# Patient Record
Sex: Male | Born: 1990 | Race: Black or African American | Hispanic: No | Marital: Single | Smoking: Never smoker
Health system: Southern US, Community
[De-identification: ages and names within clinical notes are randomized; demographics above are authoritative.]

## PROBLEM LIST (undated history)

## (undated) DIAGNOSIS — R011 Cardiac murmur, unspecified: Secondary | ICD-10-CM

---
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
## 2013-07-27 ENCOUNTER — Encounter (HOSPITAL_COMMUNITY): Payer: Self-pay | Admitting: Emergency Medicine

## 2013-07-27 ENCOUNTER — Emergency Department (HOSPITAL_COMMUNITY): Payer: Self-pay

## 2013-07-27 ENCOUNTER — Emergency Department (HOSPITAL_COMMUNITY)
Admission: EM | Admit: 2013-07-27 | Discharge: 2013-07-27 | Disposition: A | Discharge status: Home / Self Care | Payer: Self-pay | Attending: Emergency Medicine | Admitting: Emergency Medicine

## 2013-07-27 DIAGNOSIS — R079 Chest pain, unspecified: Secondary | ICD-10-CM | POA: Insufficient documentation

## 2013-07-27 DIAGNOSIS — R011 Cardiac murmur, unspecified: Secondary | ICD-10-CM | POA: Insufficient documentation

## 2013-07-27 HISTORY — DX: Cardiac murmur, unspecified: R01.1

## 2013-07-27 NOTE — ED Notes (Signed)
Patient is alert and oriented x3.  He is here to be seen for intermittent chest pain that he states He has had for a few weeks that has progressively gotten worse for him to come  In today.  Currently he denies any pain, but describes it as a sharp pain in the left side of his  Chest with no radiation of pain.

## 2013-07-27 NOTE — ED Notes (Signed)
Patient is alert and oriented x3.  He was given DC instructions and follow up visit instructions.  Patient gave verbal understanding.  He was DC ambulatory under his own power to home.  V/S stable.  He was not showing any signs of distress on DC 

## 2013-07-27 NOTE — Discharge Instructions (Signed)
Chest Pain (Nonspecific) °It is often hard to give a specific diagnosis for the cause of chest pain. There is always a chance that your pain could be related to something serious, such as a heart attack or a blood clot in the lungs. You need to follow up with your caregiver for further evaluation. °CAUSES  °· Heartburn. °· Pneumonia or bronchitis. °· Anxiety or stress. °· Inflammation around your heart (pericarditis) or lung (pleuritis or pleurisy). °· A blood clot in the lung. °· A collapsed lung (pneumothorax). It can develop suddenly on its own (spontaneous pneumothorax) or from injury (trauma) to the chest. °· Shingles infection (herpes zoster virus). °The chest wall is composed of bones, muscles, and cartilage. Any of these can be the source of the pain. °· The bones can be bruised by injury. °· The muscles or cartilage can be strained by coughing or overwork. °· The cartilage can be affected by inflammation and become sore (costochondritis). °DIAGNOSIS  °Lab tests or other studies, such as X-rays, electrocardiography, stress testing, or cardiac imaging, may be needed to find the cause of your pain.  °TREATMENT  °· Treatment depends on what may be causing your chest pain. Treatment may include: °· Acid blockers for heartburn. °· Anti-inflammatory medicine. °· Pain medicine for inflammatory conditions. °· Antibiotics if an infection is present. °· You may be advised to change lifestyle habits. This includes stopping smoking and avoiding alcohol, caffeine, and chocolate. °· You may be advised to keep your head raised (elevated) when sleeping. This reduces the chance of acid going backward from your stomach into your esophagus. °· Most of the time, nonspecific chest pain will improve within 2 to 3 days with rest and mild pain medicine. °HOME CARE INSTRUCTIONS  °· If antibiotics were prescribed, take your antibiotics as directed. Finish them even if you start to feel better. °· For the next few days, avoid physical  activities that bring on chest pain. Continue physical activities as directed. °· Do not smoke. °· Avoid drinking alcohol. °· Only take over-the-counter or prescription medicine for pain, discomfort, or fever as directed by your caregiver. °· Follow your caregiver's suggestions for further testing if your chest pain does not go away. °· Keep any follow-up appointments you made. If you do not go to an appointment, you could develop lasting (chronic) problems with pain. If there is any problem keeping an appointment, you must call to reschedule. °SEEK MEDICAL CARE IF:  °· You think you are having problems from the medicine you are taking. Read your medicine instructions carefully. °· Your chest pain does not go away, even after treatment. °· You develop a rash with blisters on your chest. °SEEK IMMEDIATE MEDICAL CARE IF:  °· You have increased chest pain or pain that spreads to your arm, neck, jaw, back, or abdomen. °· You develop shortness of breath, an increasing cough, or you are coughing up blood. °· You have severe back or abdominal pain, feel nauseous, or vomit. °· You develop severe weakness, fainting, or chills. °· You have a fever. °THIS IS AN EMERGENCY. Do not wait to see if the pain will go away. Get medical help at once. Call your local emergency services (911 in [place] Do not drive yourself to the hospital. °MAKE SURE YOU:  °· Understand these instructions. °· Will watch your condition. °· Will get help right away if you are not doing well or get worse. °Document Released: 02/10/2005 Document Revised: 07/26/2011 Document Reviewed: 12/07/2007 °[employer]® Patient Information ©2014 [employer]. ° °

## 2013-07-27 NOTE — ED Notes (Signed)
Pt has on/off chest pain x 2 weeks.  About 15 min ago, sharp chest pains.

## 2013-07-27 NOTE — ED Provider Notes (Signed)
CSN: [id number]     Arrival date & time 07/27/13  1843 History   First MD Initiated Contact with Patient 07/27/13 1912     Chief Complaint  Patient presents with  . Chest Pain     (Consider location/radiation/quality/duration/timing/severity/associated sxs/prior Treatment) HPI  23 year old male with chest pain. Intermittent over the past month. Sharp pain in the center is chest. Typically lasts several minutes. No appreciable exacerbating relieving factors. No appreciable exacerbating relieving factors. The pain does not radiate. No fevers or chills. No unusual leg is swollen. No significant past medical history aside from a "heart murmur.". Denies any drug use. No unusual leg pain or swelling.  Past Medical History  Diagnosis Date  . Murmur    History reviewed. No pertinent past surgical history. History reviewed. No pertinent family history. History  Substance Use Topics  . Smoking status: Never Smoker   . Smokeless tobacco: Not on file  . Alcohol Use: Yes     Comment: social    Review of Systems  All systems reviewed and negative, other than as noted in HPI.   Allergies  Review of patient's allergies indicates no known allergies.  Home Medications  No current outpatient prescriptions on file. There were no vitals taken for this visit. Physical Exam  Nursing note and vitals reviewed. Constitutional: He appears well-developed and well-nourished. No distress.  HENT:  Head: Normocephalic and atraumatic.  Eyes: Conjunctivae are normal. Right eye exhibits no discharge. Left eye exhibits no discharge.  Neck: Neck supple.  Cardiovascular: Normal rate, regular rhythm and normal heart sounds.  Exam reveals no gallop and no friction rub.   No murmur heard. Pulmonary/Chest: Effort normal and breath sounds normal. No respiratory distress.  Abdominal: Soft. He exhibits no distension. There is no tenderness.  Musculoskeletal: He exhibits no edema and no tenderness.  Lower  extremities symmetric as compared to each other. No calf tenderness. Negative Homan's. No palpable cords.   Neurological: He is alert.  Skin: Skin is warm and dry.  Psychiatric: He has a normal mood and affect. His behavior is normal. Thought content normal.    ED Course  Procedures (including critical care time) Labs Review Labs Reviewed - No data to display Imaging Review Dg Chest 2 View  07/27/2013   CLINICAL DATA:  Chest pain.  EXAM: CHEST  2 VIEW  COMPARISON:  None.  FINDINGS: The heart size and mediastinal contours are within normal limits. Both lungs are clear. The visualized skeletal structures are unremarkable.  IMPRESSION: No active cardiopulmonary disease.   Electronically Signed   By: Loralie ChampagneMark  Gallerani M.D.   On: 07/27/2013 19:51     EKG Interpretation   Date/Time:  Friday July 27 2013 19:00:03 EDT Ventricular Rate:  80 PR Interval:  144 QRS Duration: 98 QT Interval:  368 QTC Calculation: 424 R Axis:   85 Text Interpretation:  Sinus rhythm q waves inferiorly and lateral  precordial leads, consider HOCM No old tracing to compare Confirmed by  Francesca Strome  MD, Jonovan Boedecker ([id number]) on 07/27/2013 7:46:20 PM      MDM   Final diagnoses:  Chest pain    23 year old male with intermittent chest pain. Atypical for ACS. Patient does have an abnormal EKG. Consider hypertrophic obstructive cardiomyopathy. No old EKG for comparison. Currently symptom-free. No history of syncope. Symptoms aren't necessarily exertional. Otherwise healthy. Discussed case with cardiology, Dr Gala RomneyBensimhon, who reviewed EKG. Non concerning to him. Return precautions discussed. From [place]. Follow-up with     Raeford RazorStephen Ossie Yebra, MD 07/31/13  2237 

## 2015-09-20 IMAGING — CR DG CHEST 2V
2 series · 2 of 2 positions shown · non-contrast
Comparison: None.

CLINICAL DATA: Chest pain.

EXAM:
CHEST  2 VIEW

[w chest pa]
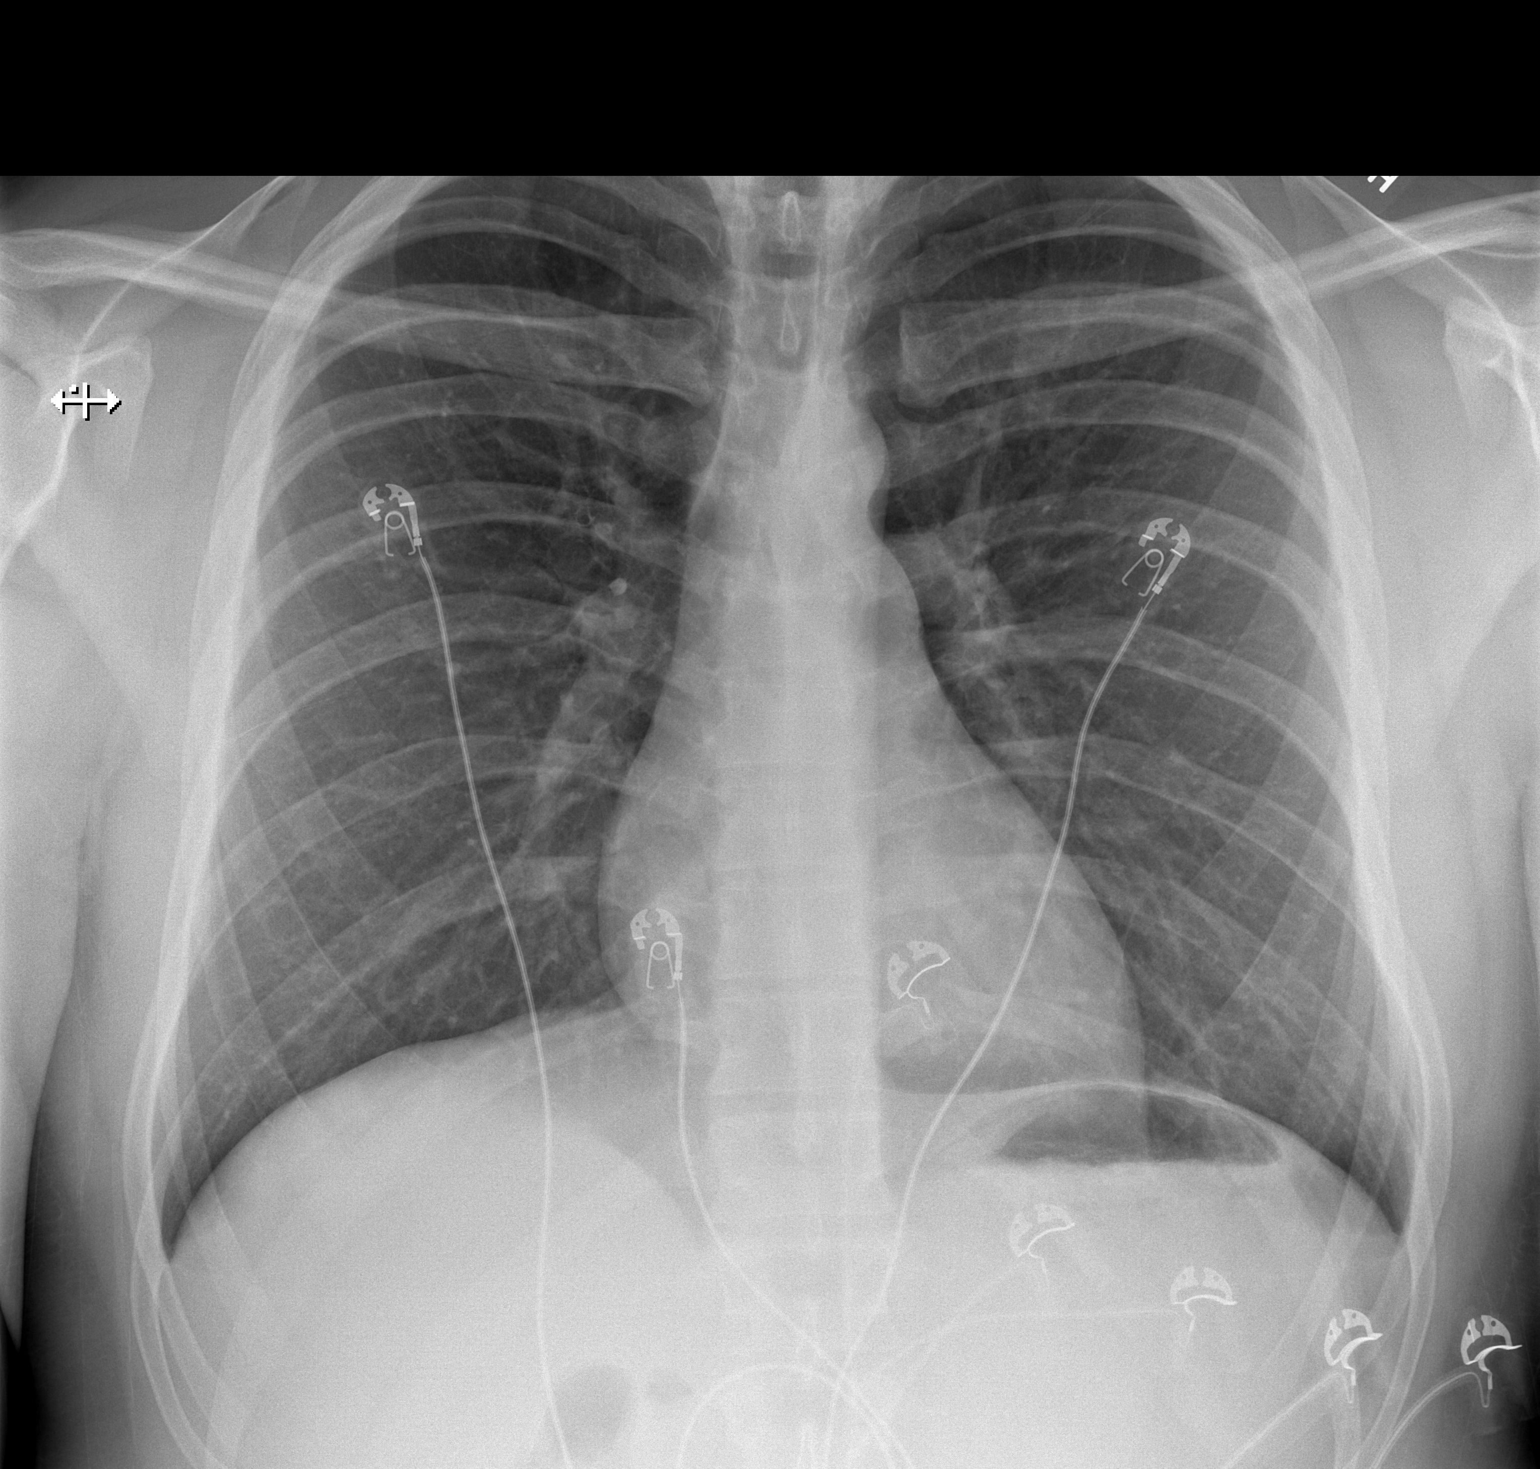

[w chest lat]
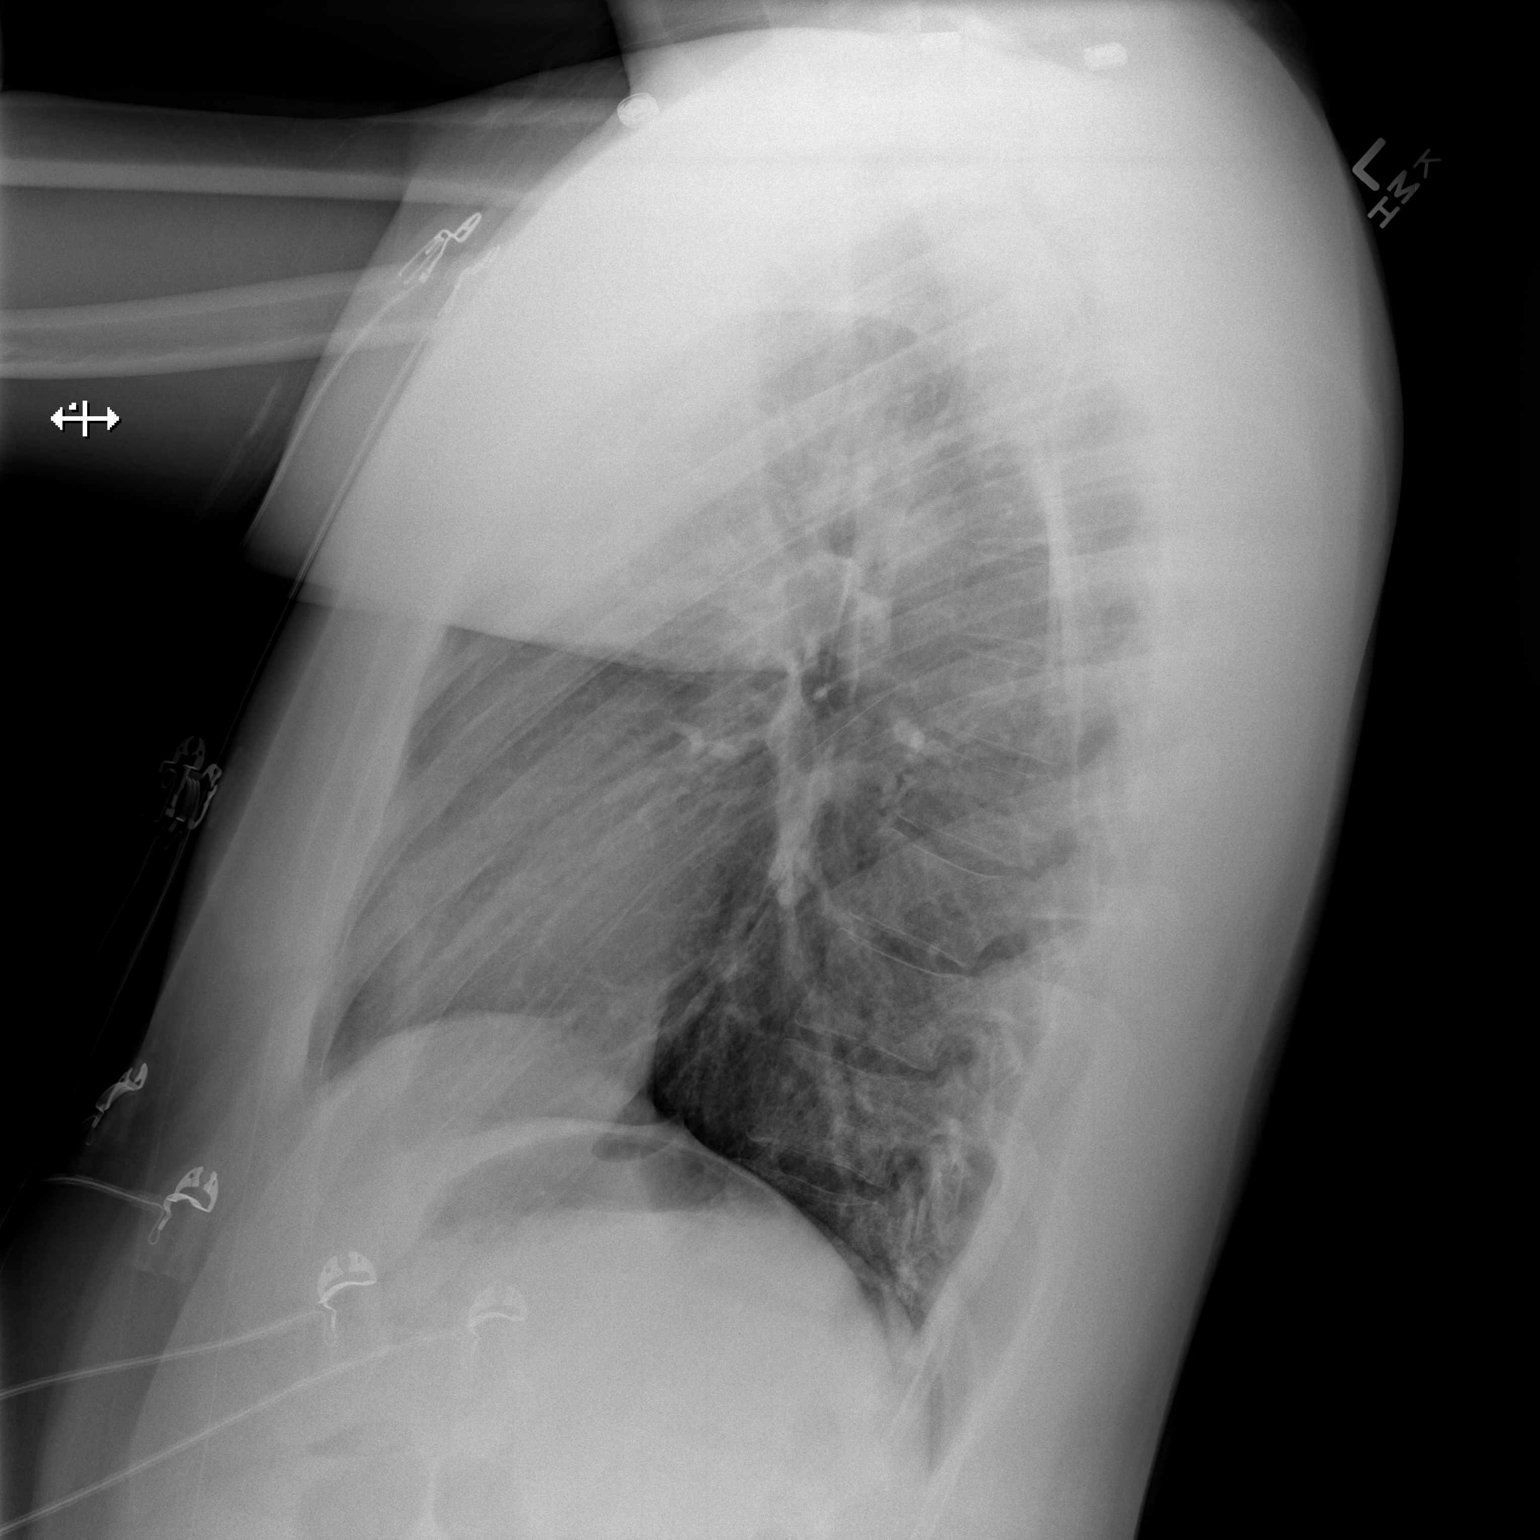

[2 of 2 positions shown; findings below may reference images not displayed]

FINDINGS: The heart size and mediastinal contours are within normal limits.
Both lungs are clear. The visualized skeletal structures are
unremarkable.
IMPRESSION: No active cardiopulmonary disease.
